# Patient Record
Sex: Male | Born: 1980 | Race: White | Hispanic: No | Marital: Single | Smoking: Former smoker
Health system: Southern US, Community
[De-identification: ages and names within clinical notes are randomized; demographics above are authoritative.]

## PROBLEM LIST (undated history)

## (undated) DIAGNOSIS — R911 Solitary pulmonary nodule: Secondary | ICD-10-CM

## (undated) HISTORY — DX: Solitary pulmonary nodule: R91.1

---

## 2010-08-13 ENCOUNTER — Encounter: Payer: Self-pay | Admitting: Physician Assistant

## 2010-08-15 ENCOUNTER — Emergency Department: Payer: Self-pay | Admitting: Emergency Medicine

## 2010-08-30 ENCOUNTER — Encounter: Payer: Self-pay | Admitting: Physician Assistant

## 2016-01-14 DIAGNOSIS — M459 Ankylosing spondylitis of unspecified sites in spine: Secondary | ICD-10-CM | POA: Insufficient documentation

## 2016-01-23 DIAGNOSIS — G8929 Other chronic pain: Secondary | ICD-10-CM | POA: Insufficient documentation

## 2016-01-23 DIAGNOSIS — M545 Low back pain: Secondary | ICD-10-CM

## 2016-01-23 DIAGNOSIS — M436 Torticollis: Secondary | ICD-10-CM | POA: Insufficient documentation

## 2016-02-11 DIAGNOSIS — R911 Solitary pulmonary nodule: Secondary | ICD-10-CM | POA: Insufficient documentation

## 2016-02-14 DIAGNOSIS — R55 Syncope and collapse: Secondary | ICD-10-CM | POA: Insufficient documentation

## 2016-02-14 DIAGNOSIS — M160 Bilateral primary osteoarthritis of hip: Secondary | ICD-10-CM | POA: Insufficient documentation

## 2016-08-01 ENCOUNTER — Ambulatory Visit (INDEPENDENT_AMBULATORY_CARE_PROVIDER_SITE_OTHER): Payer: 59 | Admitting: Internal Medicine

## 2016-08-01 ENCOUNTER — Encounter: Payer: Self-pay | Admitting: Internal Medicine

## 2016-08-01 VITALS — BP 90/68 | HR 66 | Resp 16 | Ht 64.0 in | Wt 154.0 lb

## 2016-08-01 DIAGNOSIS — R0602 Shortness of breath: Secondary | ICD-10-CM | POA: Diagnosis not present

## 2016-08-01 DIAGNOSIS — R918 Other nonspecific abnormal finding of lung field: Secondary | ICD-10-CM

## 2016-08-01 NOTE — Progress Notes (Signed)
[facility]      Date: 08/01/2016,   MRN# [id number] Xavier Curtis Melichar [birth year] Code Status:  Code Status History    This patient does not have a recorded code status. Please follow your organizational policy for patients in this situation.     Hosp day:@LENGTHOFSTAYDAYS @ Referring MD: @ATDPROV @     PCP:      AdmissionWeight: 154 lb (69.9 kg)                 CurrentWeight: 154 lb (69.9 kg) Xavier Curtis Curtis is a 36 y.o. old male seen in consultation for pulm  nodules at the request of D. Renard MatterBock.     CHIEF COMPLAINT:   Lung spots on CT scan   HISTORY OF PRESENT ILLNESS  36 yo AAM with h/o Ankylosing Spondylitis with incidental findings of multiple b/l nodules on CT chest Reports states that there are multiple, b/l 3-8MM in size.  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     PAST MEDICAL HISTORY   Past Medical History:  Diagnosis Date  . Lung nodule      SURGICAL HISTORY   History reviewed. No pertinent surgical history.   FAMILY HISTORY   Family History  Problem Relation Age of Onset  . Carpal tunnel syndrome Mother   . Diabetes Father   . Hypertension Father   . Hyperlipidemia Father   . Osteoporosis Father      SOCIAL HISTORY   Social History  Substance Use Topics  . Smoking status: Former Games developermoker  . Smokeless tobacco: Never Used  . Alcohol use Yes     MEDICATIONS    Home Medication:  Current Outpatient Rx  . Order #: [id number] Class: Historical Med  . Order #: [id number] Class: Historical Med  . Order #: [id number] Class: Historical Med    Current Medication:  Current Outpatient Prescriptions:  .  cyclobenzaprine (FLEXERIL) 10 MG tablet,  Take 10 mg by mouth at bedtime as needed., Disp: , Rfl:  .  diazepam (VALIUM) 10 MG tablet, Take 10 mg by mouth every 12 (twelve) hours as needed., Disp: , Rfl:  .  indomethacin (INDOCIN) 25 MG capsule, Take 25 mg by mouth 2 (two) times daily as needed. , Disp: , Rfl:     ALLERGIES   Patient has no known allergies.     REVIEW OF SYSTEMS    Review of Systems:  Gen:  Denies  fever, sweats, chills weigh loss  HEENT: Denies blurred vision, double vision, ear pain, eye pain, hearing loss, nose bleeds, sore throat Cardiac:  No dizziness, chest pain or heaviness, chest tightness,edema Resp:   Denies cough or sputum porduction, shortness of breath,wheezing, hemoptysis,  Gi: Denies swallowing difficulty, stomach pain, nausea or vomiting, diarrhea, constipation, bowel incontinence Gu:  Denies bladder incontinence, burning urine Ext:   Denies Joint pain, stiffness or swelling Skin: Denies  skin rash, easy bruising or bleeding or hives Endoc:  Denies polyuria, polydipsia , polyphagia or weight change Psych:   Denies depression, insomnia or hallucinations   Other:  All other systems negative   VS: BP 90/68 (BP Location: Left Arm, Patient Position: Sitting, Cuff Size: Normal)   Pulse 66  Resp 16   Ht 5\' 4"  (1.626 m)   Wt 154 lb (69.9 kg)   SpO2 98%   BMI 26.43 kg/m      PHYSICAL EXAM  Physical Examination:   GENERAL:NAD, no fevers, chills, no weakness no fatigue HEAD: Normocephalic, atraumatic.  EYES: Pupils equal, round, reactive to light. Extraocular muscles intact. No scleral icterus.  MOUTH: Moist mucosal membrane. Dentition intact. No abscess noted.  EAR, NOSE, THROAT: Clear without exudates. No external lesions.  NECK: Supple. No thyromegaly. No nodules. No JVD.  PULMONARY: Diffuse coarse rhonchi right sided +wheezes CARDIOVASCULAR: S1 and S2. Regular rate and rhythm. No murmurs, rubs, or gallops. No edema. Pedal pulses 2+ bilaterally.  GASTROINTESTINAL: Soft,  nontender, nondistended. No masses. Positive bowel sounds. No hepatosplenomegaly.  MUSCULOSKELETAL: No swelling, clubbing, or edema. Range of motion full in all extremities.  NEUROLOGIC: Cranial nerves II through XII are intact. No gross focal neurological deficits. Sensation intact. Reflexes intact.  SKIN: No ulceration, lesions, rashes, or cyanosis. Skin warm and dry. Turgor intact.  PSYCHIATRIC: Mood, affect within normal limits. The patient is awake, alert and oriented x 3. Insight, judgment intact.    IMAGING    No results found.    ASSESSMENT/PLAN   36 yo AAM with incidental findings  of b/l pulmonary sub centimeter nodules  I will need to see CT scans for better assessment I have explained to patient to obtain these scans and will follow up  AT this time, I do not believe these nodules are infections, possible inflammatory. His AS is severe and will need to take biological therapy for his condition. These nodules may have been there for awhile.  Will need to re-assess when CT scans are available  Patient satisfied with Plan of action and management. All questions answered  Lucie Leather, M.D.  [facility]  Medical Director [facility] Medical Director [facility] Cardio-Pulmonary Department

## 2016-08-01 NOTE — Patient Instructions (Signed)
I NEED TO SEE CT SCANS

## 2016-12-25 ENCOUNTER — Encounter: Payer: Self-pay | Admitting: Emergency Medicine

## 2016-12-25 ENCOUNTER — Emergency Department
Admission: EM | Admit: 2016-12-25 | Discharge: 2016-12-25 | Disposition: A | Discharge status: Home / Self Care | Payer: BLUE CROSS/BLUE SHIELD | Attending: Emergency Medicine | Admitting: Emergency Medicine

## 2016-12-25 DIAGNOSIS — F419 Anxiety disorder, unspecified: Secondary | ICD-10-CM | POA: Insufficient documentation

## 2016-12-25 NOTE — ED Triage Notes (Addendum)
Says wants to talk to someone. Anxiety.  Says he feels depressed, but does not want to harm self

## 2016-12-25 NOTE — ED Notes (Signed)
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  He says his therapist told him to come here for that, but did not realize how this all works.  Does not want to change into  Scubs, and is not suicidal.  He says he is going to leave and will call his therapist.

## 2017-09-17 ENCOUNTER — Encounter: Payer: Self-pay | Admitting: Internal Medicine

## 2020-10-09 ENCOUNTER — Other Ambulatory Visit: Payer: Self-pay

## 2020-10-09 ENCOUNTER — Emergency Department: Payer: Medicare Other

## 2020-10-09 ENCOUNTER — Emergency Department
Admission: EM | Admit: 2020-10-09 | Discharge: 2020-10-09 | Disposition: A | Discharge status: Home / Self Care | Payer: Medicare Other | Attending: Emergency Medicine | Admitting: Emergency Medicine

## 2020-10-09 DIAGNOSIS — M546 Pain in thoracic spine: Secondary | ICD-10-CM | POA: Diagnosis present

## 2020-10-09 DIAGNOSIS — Z87891 Personal history of nicotine dependence: Secondary | ICD-10-CM | POA: Insufficient documentation

## 2020-10-09 DIAGNOSIS — R42 Dizziness and giddiness: Secondary | ICD-10-CM | POA: Insufficient documentation

## 2020-10-09 DIAGNOSIS — Z20822 Contact with and (suspected) exposure to covid-19: Secondary | ICD-10-CM | POA: Diagnosis not present

## 2020-10-09 DIAGNOSIS — R0789 Other chest pain: Secondary | ICD-10-CM | POA: Diagnosis not present

## 2020-10-09 DIAGNOSIS — R55 Syncope and collapse: Secondary | ICD-10-CM | POA: Insufficient documentation

## 2020-10-09 DIAGNOSIS — R519 Headache, unspecified: Secondary | ICD-10-CM | POA: Diagnosis not present

## 2020-10-09 DIAGNOSIS — M542 Cervicalgia: Secondary | ICD-10-CM | POA: Diagnosis not present

## 2020-10-09 DIAGNOSIS — M450 Ankylosing spondylitis of multiple sites in spine: Secondary | ICD-10-CM | POA: Diagnosis not present

## 2020-10-09 LAB — TROPONIN I (HIGH SENSITIVITY)
Troponin I (High Sensitivity): 3 ng/L (ref <18)
Troponin I (High Sensitivity): 3 ng/L (ref <18)

## 2020-10-09 LAB — CBC WITH DIFFERENTIAL/PLATELET
Abs Immature Granulocytes: 0.01 10*3/uL (ref 0.00–0.07)
Basophils Absolute: 0 10*3/uL (ref 0.0–0.1)
Basophils Relative: 0 %
Eosinophils Absolute: 0.1 10*3/uL (ref 0.0–0.5)
Eosinophils Relative: 2 %
HCT: 42.1 % (ref 39.0–52.0)
Hemoglobin: 14.8 g/dL (ref 13.0–17.0)
Immature Granulocytes: 0 %
Lymphocytes Relative: 25 %
Lymphs Abs: 1.1 10*3/uL (ref 0.7–4.0)
MCH: 33 pg (ref 26.0–34.0)
MCHC: 35.2 g/dL (ref 30.0–36.0)
MCV: 94 fL (ref 80.0–100.0)
Monocytes Absolute: 0.5 10*3/uL (ref 0.1–1.0)
Monocytes Relative: 11 %
Neutro Abs: 2.8 10*3/uL (ref 1.7–7.7)
Neutrophils Relative %: 62 %
Platelets: 206 10*3/uL (ref 150–400)
RBC: 4.48 MIL/uL (ref 4.22–5.81)
RDW: 12.2 % (ref 11.5–15.5)
WBC: 4.5 10*3/uL (ref 4.0–10.5)
nRBC: 0 % (ref 0.0–0.2)

## 2020-10-09 LAB — COMPREHENSIVE METABOLIC PANEL
ALT: 14 U/L (ref 0–44)
AST: 19 U/L (ref 15–41)
Albumin: 4.1 g/dL (ref 3.5–5.0)
Alkaline Phosphatase: 63 U/L (ref 38–126)
Anion gap: 9 (ref 5–15)
BUN: 14 mg/dL (ref 6–20)
CO2: 25 mmol/L (ref 22–32)
Calcium: 9 mg/dL (ref 8.9–10.3)
Chloride: 102 mmol/L (ref 98–111)
Creatinine, Ser: 0.87 mg/dL (ref 0.61–1.24)
GFR, Estimated: >60 mL/min (ref >60)
Glucose, Bld: 88 mg/dL (ref 70–99)
Potassium: 3.9 mmol/L (ref 3.5–5.1)
Sodium: 136 mmol/L (ref 135–145)
Total Bilirubin: 1.9 mg/dL — ABNORMAL HIGH (ref 0.3–1.2)
Total Protein: 7.2 g/dL (ref 6.5–8.1)

## 2020-10-09 LAB — RESP PANEL BY RT-PCR (FLU A&B, COVID) ARPGX2
Influenza A by PCR: NEGATIVE
Influenza B by PCR: NEGATIVE
SARS Coronavirus 2 by RT PCR: NEGATIVE

## 2020-10-09 LAB — D-DIMER, QUANTITATIVE: D-Dimer, Quant: 0.38 ug/mL-FEU (ref 0.00–0.50)

## 2020-10-09 LAB — MAGNESIUM: Magnesium: 1.9 mg/dL (ref 1.7–2.4)

## 2020-10-09 LAB — CK: Total CK: 113 U/L (ref 49–397)

## 2020-10-09 MED ORDER — LACTATED RINGERS IV BOLUS
1000.0000 mL | Freq: Once | INTRAVENOUS | Status: AC
  Administered 2020-10-09: 1000 mL via INTRAVENOUS

## 2020-10-09 NOTE — ED Notes (Signed)
Pt to Xray.

## 2020-10-09 NOTE — ED Provider Notes (Signed)
[facility] Emergency Department Provider Note  ____________________________________________   Event Date/Time   First MD Initiated Contact with Patient 10/09/20 1136     (approximate)  I have reviewed the triage vital signs and the nursing notes.   HISTORY  Chief Complaint Loss of Consciousness   HPI Xavier Curtis is a 40 y.o. male with a past medical history of ankylosing spondylitis followed by rheumatology previously on Enbrel and Humira but not currently taking these medicines due to some insurance issues and pulse stopped this month due to some rash she develop.  He states today he felt lightheaded and dizzy and had some chest pressure and lay down on the floor because he felt he was going pass out.  His father states he does not think he lost consciousness as he witnessed him down although patient states he is not sure if he hit her head is or not because he has a little bit of posterior headache and neck pain that seems little more than usual.  He has not had any new vomiting, diarrhea, dysuria, abdominal pain or lower back pain or focal weakness numbness or tingling.  States he was told by his rheumatologist that his rash is likely from his ankylosing spondylitis medications which he is no longer taking.  He has not had any significant redness and there is no drainage.  No other clear acute concerns at this time.  States something similar happened where he felt he is in a pass out several months ago.  No other acute concerns at this time.         Past Medical History:  Diagnosis Date  . Lung nodule     Patient Active Problem List   Diagnosis Date Noted  . Cardiac syncope 02/14/2016  . Primary osteoarthritis of both hips 02/14/2016  . Incidental lung nodule, > 3mm and < 8mm 02/11/2016  . Chronic midline low back pain without sciatica 01/23/2016  . Neck stiffness 01/23/2016  . Ankylosing spondylitis (HCC) 01/14/2016    No past surgical history  on file.  Prior to Admission medications   Medication Sig Start Date End Date Taking? Authorizing Provider  cyclobenzaprine (FLEXERIL) 10 MG tablet Take 10 mg by mouth at bedtime as needed. 01/21/16   [provider]  diazepam (VALIUM) 10 MG tablet Take 10 mg by mouth every 12 (twelve) hours as needed.    [provider]  indomethacin (INDOCIN) 25 MG capsule Take 25 mg by mouth 2 (two) times daily as needed.     [provider]    Allergies Patient has no known allergies.  Family History  Problem Relation Age of Onset  . Carpal tunnel syndrome Mother   . Diabetes Father   . Hypertension Father   . Hyperlipidemia Father   . Osteoporosis Father     Social History Social History   Tobacco Use  . Smoking status: Former    Pack years: 0.00  . Smokeless tobacco: Never  Vaping Use  . Vaping Use: Never used  Substance Use Topics  . Alcohol use: Yes  . Drug use: Yes    Types: Marijuana    Review of Systems  Review of Systems  Constitutional:  Negative for chills and fever.  HENT:  Negative for sore throat.   Eyes:  Negative for pain.  Respiratory:  Negative for cough and stridor.   Cardiovascular:  Positive for chest pain.  Gastrointestinal:  Positive for nausea. Negative for abdominal pain and vomiting.  Genitourinary:  Negative for dysuria.  Musculoskeletal:  Positive for back pain (chronic), joint pain (chronic diffuse spine pain) and neck pain (chronic, maybe slightly worse today).  Skin:  Positive for rash.  Neurological:  Positive for weakness. Negative for seizures and headaches.  Psychiatric/Behavioral:  Negative for suicidal ideas.   All other systems reviewed and are negative.    ____________________________________________   PHYSICAL EXAM:  VITAL SIGNS: ED Triage Vitals  Enc Vitals Group     BP      Pulse      Resp      Temp      Temp src      SpO2      Weight      Height      Head Circumference      Peak Flow       Pain Score      Pain Loc      Pain Edu?      Excl. in GC?    Vitals:   10/09/20 1330 10/09/20 1442  BP: 108/69   Pulse: 72 92  Resp: 17   Temp:    SpO2: 97% 97%   Physical Exam Vitals and nursing note reviewed.  Constitutional:      Appearance: He is well-developed.  HENT:     Head: Normocephalic and atraumatic.     Right Ear: External ear normal.     Left Ear: External ear normal.     Nose: Nose normal.     Mouth/Throat:     Mouth: Mucous membranes are moist.  Eyes:     Conjunctiva/sclera: Conjunctivae normal.  Cardiovascular:     Rate and Rhythm: Normal rate and regular rhythm.     Heart sounds: No murmur heard. Pulmonary:     Effort: Pulmonary effort is normal. No respiratory distress.     Breath sounds: Normal breath sounds.  Abdominal:     Palpations: Abdomen is soft.     Tenderness: There is no abdominal tenderness.  Musculoskeletal:     Cervical back: Neck supple.  Skin:    General: Skin is warm and dry.     Capillary Refill: Capillary refill takes less than 2 seconds.  Neurological:     Mental Status: He is alert and oriented to person, place, and time.  Psychiatric:        Mood and Affect: Mood normal.    Cranial nerves II through XII grossly intact.    Symmetric 5/5 strength of all extremities.  Sensation intact to light touch in all extremities.  Unremarkable unassisted gait.  Some mild tenderness over the C-spine without any significant midline tenderness over the T or L spine and no step-offs or deformities.  2+ radial pulses.  There is some scattered pustular lesions without drainage or surrounding skin changes over the bilateral forearms.  ____________________________________________   LABS (all labs ordered are listed, but only abnormal results are displayed)  Labs Reviewed  COMPREHENSIVE METABOLIC PANEL - Abnormal; Notable for the following components:      Result Value   Total Bilirubin 1.9 (*)    All other components within normal limits   RESP PANEL BY RT-PCR (FLU A&B, COVID) ARPGX2  CBC WITH DIFFERENTIAL/PLATELET  D-DIMER, QUANTITATIVE  MAGNESIUM  CK  CBG MONITORING, ED  TROPONIN I (HIGH SENSITIVITY)  TROPONIN I (HIGH SENSITIVITY)   ____________________________________________  EKG  Sinus rhythm with a ventricular to 54, left axis deviation, unremarkable intervals some nonspecific changes in lead III and lead I and  aVL.  No significant arrhythmia. ____________________________________________  RADIOLOGY  ED MD interpretation: Chest x-ray has no edema, focal consolidation, effusion, pneumothorax or other clear acute thoracic process.  CT head shows no clear acute intracranial process including evidence of skull fracture or SAH.  CT C-spine shows redemonstrated findings consistent with ankylosing spondylitis as well as some widening at the disc base of C4 on C5 with some lordosis.  This is similar to films obtained in 2012.  There is also trace-retrolisthesis of C4 on C5 similar to prior.  MR C-spine shows widening of the C4-C5 disc base anteriorly with focal lordosis possibly from her injury or some ligamentous laxity.  No edema to suggest acute injury.  No fracture.  There is some disc base widening seen on imaging in 2012 (this time there is also some degenerative changes at C4 and C5 with moderate canal stenosis on the right and moderate left canal stenosis.  Official radiology report(s): DG Chest 2 View  Result Date: 10/09/2020 CLINICAL DATA:  Syncope. EXAM: CHEST - 2 VIEW COMPARISON:  No pertinent prior exams available for comparison. FINDINGS: Heart size within normal limits. Linear opacities within the right mid to lower lung field with an appearance most suggestive of scarring. No appreciable airspace consolidation or pulmonary edema. No evidence of pleural effusion or pneumothorax. No acute bony abnormality identified. Midthoracic dextrocurvature. IMPRESSION: No evidence of acute cardiopulmonary abnormality. Linear  opacities within the right mid to lower lung field with an appearance most suggestive of scarring. Midthoracic dextrocurvature. Electronically Signed   By: Jackey Loge DO   On: 10/09/2020 13:08   CT Head Wo Contrast  Result Date: 10/09/2020 CLINICAL DATA:  Headache, intracranial hemorrhage suspected. Neck trauma, midline tenderness. Additional provided: Syncopal episode. EXAM: CT HEAD WITHOUT CONTRAST CT CERVICAL SPINE WITHOUT CONTRAST TECHNIQUE: Multidetector CT imaging of the head and cervical spine was performed following the standard protocol without intravenous contrast. Multiplanar CT image reconstructions of the cervical spine were also generated. COMPARISON:  Cervical spine radiographs 08/16/2010. Report from radiographs of the cervical spine 01/23/2016 (images unavailable). FINDINGS: CT HEAD FINDINGS Brain: Cerebral volume is normal. There is no acute intracranial hemorrhage. No demarcated cortical infarct. No extra-axial fluid collection. No evidence of an intracranial mass. No midline shift. Vascular: No hyperdense vessel. Skull: Normal. Negative for fracture or focal lesion. Sinuses/Orbits: Visualized orbits show no acute finding. Trace scattered paranasal sinus mucosal thickening at the imaged levels. CT CERVICAL SPINE FINDINGS Alignment: Focal lordotic angulation at the C4-C5 level. There is widening of the disc space anteriorly at C4-C5 measuring 5 mm. This is similar in appearance as compared to the prior cervical spine radiographs of 08/16/2010. Trace C4-C5 grade 1 retrolisthesis. Skull base and vertebrae: Bilateral atlantooccipital fusion. The basion-dental and atlanto-dental intervals are maintained. MRI would be needed to definitively exclude a fracture through the C4-C5 disc space. No evidence of acute fracture to the cervical spine elsewhere. Redemonstrated findings of ankylosing spondylitis with bridging syndesmophytes, early osseous fusion across the disc space and facet joint ankylosis  at multiple levels throughout the cervical and visualized upper thoracic spine. Notably, there are no bridging syndesmophytes at C4-C5, and there is no facet joint ankylosis on the right at C4-C5 or on the left at C3-C4. Soft tissues and spinal canal: No prevertebral fluid or swelling. No visible canal hematoma. Disc levels: Cervical spondylosis greatest at C4-C5. At C4-C5, there is mild disc space narrowing, broad-based central disc protrusion. Endplate spurring with bilateral disc osteophyte ridge/uncinate hypertrophy. Facet arthrosis and ligamentum  flavum hypertrophy. Apparent mild/moderate spinal canal stenosis. Advanced right-sided bony neural foraminal narrowing. Upper chest: No consolidation within the imaged lung apices. Scarring within the right lung apex. IMPRESSION: CT head: 1. No evidence of acute intracranial abnormality. 2. Mild paranasal sinus mucosal thickening at the imaged levels. CT cervical spine: 1. Redemonstrated findings compatible with ankylosing spondylitis, as described. 2. Widening of the disc space anteriorly at C4-C5 with somewhat focal lordosis centered at this level. These findings are similar as compared to the cervical spine radiographs of 08/16/2010. However, in the setting of trauma an MRI would be needed to definitively exclude a fracture through the C4-C5 disc space or ligamentous injury at this level. 3. Trace C4-C5 grade 1 retrolisthesis, also similar to the prior cervical spine radiographs of 08/16/2010. 4. Cervical spondylosis, as described and greatest at C4-C5. Electronically Signed   By: Jackey Loge DO   On: 10/09/2020 13:01   CT Cervical Spine Wo Contrast  Result Date: 10/09/2020 CLINICAL DATA:  Headache, intracranial hemorrhage suspected. Neck trauma, midline tenderness. Additional provided: Syncopal episode. EXAM: CT HEAD WITHOUT CONTRAST CT CERVICAL SPINE WITHOUT CONTRAST TECHNIQUE: Multidetector CT imaging of the head and cervical spine was performed following  the standard protocol without intravenous contrast. Multiplanar CT image reconstructions of the cervical spine were also generated. COMPARISON:  Cervical spine radiographs 08/16/2010. Report from radiographs of the cervical spine 01/23/2016 (images unavailable). FINDINGS: CT HEAD FINDINGS Brain: Cerebral volume is normal. There is no acute intracranial hemorrhage. No demarcated cortical infarct. No extra-axial fluid collection. No evidence of an intracranial mass. No midline shift. Vascular: No hyperdense vessel. Skull: Normal. Negative for fracture or focal lesion. Sinuses/Orbits: Visualized orbits show no acute finding. Trace scattered paranasal sinus mucosal thickening at the imaged levels. CT CERVICAL SPINE FINDINGS Alignment: Focal lordotic angulation at the C4-C5 level. There is widening of the disc space anteriorly at C4-C5 measuring 5 mm. This is similar in appearance as compared to the prior cervical spine radiographs of 08/16/2010. Trace C4-C5 grade 1 retrolisthesis. Skull base and vertebrae: Bilateral atlantooccipital fusion. The basion-dental and atlanto-dental intervals are maintained. MRI would be needed to definitively exclude a fracture through the C4-C5 disc space. No evidence of acute fracture to the cervical spine elsewhere. Redemonstrated findings of ankylosing spondylitis with bridging syndesmophytes, early osseous fusion across the disc space and facet joint ankylosis at multiple levels throughout the cervical and visualized upper thoracic spine. Notably, there are no bridging syndesmophytes at C4-C5, and there is no facet joint ankylosis on the right at C4-C5 or on the left at C3-C4. Soft tissues and spinal canal: No prevertebral fluid or swelling. No visible canal hematoma. Disc levels: Cervical spondylosis greatest at C4-C5. At C4-C5, there is mild disc space narrowing, broad-based central disc protrusion. Endplate spurring with bilateral disc osteophyte ridge/uncinate hypertrophy. Facet  arthrosis and ligamentum flavum hypertrophy. Apparent mild/moderate spinal canal stenosis. Advanced right-sided bony neural foraminal narrowing. Upper chest: No consolidation within the imaged lung apices. Scarring within the right lung apex. IMPRESSION: CT head: 1. No evidence of acute intracranial abnormality. 2. Mild paranasal sinus mucosal thickening at the imaged levels. CT cervical spine: 1. Redemonstrated findings compatible with ankylosing spondylitis, as described. 2. Widening of the disc space anteriorly at C4-C5 with somewhat focal lordosis centered at this level. These findings are similar as compared to the cervical spine radiographs of 08/16/2010. However, in the setting of trauma an MRI would be needed to definitively exclude a fracture through the C4-C5 disc space or ligamentous injury at  this level. 3. Trace C4-C5 grade 1 retrolisthesis, also similar to the prior cervical spine radiographs of 08/16/2010. 4. Cervical spondylosis, as described and greatest at C4-C5. Electronically Signed   By: Jackey Loge DO   On: 10/09/2020 13:01   MR Cervical Spine Wo Contrast  Result Date: 10/09/2020 EXAM: MRI CERVICAL SPINE WITHOUT CONTRAST TECHNIQUE: Multiplanar, multisequence MR imaging of the cervical spine was performed. No intravenous contrast was administered. COMPARISON:  None. FINDINGS: Alignment: There is widening of the C4-C5 disc space anteriorly. Vertebrae: No bone marrow edema to suggest acute fracture. Vertebral body heights are maintained. No evidence of discitis/osteomyelitis or suspicious bone lesion. Findings of ankylosing spondylitis (including multilevel. Jean syndesmophytes and fusion across the disc spaces) were better characterized on same day CT. Also, fusion at the Lantus it'll joint. Cord: Normal cord signal. Posterior Fossa, vertebral arteries, paraspinal tissues: Visualized vertebral artery flow voids are maintained. No appreciable paraspinal or prevertebral edema. No clear edema  or discontinuity of the visualized cervical ligaments. Visualized posterior fossa is unremarkable on limited sagittal assessment. Disc levels: C2-C3: No significant disc protrusion, foraminal stenosis, or canal stenosis. C3-C4: Left facet hypertrophy with mild left foraminal stenosis. C4-C5: Widening of the anterior disc space, as described above. No edema in the disc. Posterior disc osteophyte complex with right greater than left facet and uncovertebral hypertrophy. Resulting severe right and moderate left foraminal stenosis. Moderate canal stenosis. C5-C6: Mild right uncovertebral hypertrophy with mild right foraminal stenosis. No significant canal stenosis. C6-C7: No significant disc protrusion, foraminal stenosis, or canal stenosis. C7-T1: No significant disc protrusion, foraminal stenosis, or canal stenosis. IMPRESSION: 1. Widening of the C4-C5 disc space anteriorly with focal lordosis, which may be due to ligamentous laxity or remote anterior longitudinal ligament injury. There is no edema at this level to suggest acute injury. Additionally, there was widening of the disc space at this level on the prior radiographs from 2010/07/23, although less pronounced at that time. 2. Focal degenerative changes at C4-C5 with moderate canal stenosis and severe right and moderate left foraminal stenosis. The focality of degenerative change at this level is likely secondary to motion at this level in the setting of fusion at the other levels (but no fusion at C4-C5). Given these findings and the findings in impression #1, neurosurgical consultation may be useful to evaluate if fusion is warranted. 3. Mild foraminal stenosis on the left at C3-C4 and the right at C5-C6. 4. Findings of ankylosis spondylitis were better characterized on same day CT. Findings discussed with Dr. Katrinka Blazing via telephone at 2:40 p.m. Electronically Signed   By: Feliberto Harts MD   On: 10/09/2020 15:00     ____________________________________________   PROCEDURES  Procedure(s) performed (including Critical Care):  .1-3 Lead EKG Interpretation  Date/Time: 10/09/2020 3:31 PM Performed by: Gilles Chiquito, MD Authorized by: Gilles Chiquito, MD     Interpretation: normal     ECG rate assessment: normal     Rhythm: sinus rhythm     Ectopy: none     Conduction: normal     ____________________________________________   INITIAL IMPRESSION / ASSESSMENT AND PLAN / ED COURSE      Patient presents with above-stated history exam for assessment of near syncopal episode associate with some chest pain the patient himself began experiencing a little bit of acute on chronic neck pain in setting of chronic back pain.  Patient states he has had a little nausea and some chest discomfort that was recently seen by his rheumatologist yesterday  and taken off Toltz with concerns that this was causing pustular rash in his arms.  With prior to syncopal episode differential includes orthostasis, vasovagal, anemia, arrhythmia, PE, metabolic derangements with concern for possible acute head or C-spine injury given patient stating he laid himself down and may have hit his head and has a little bit of neck pain seems to be a touch worse than usual.  Will ECG has a nonspecific findings given nonelevated troponin x2 I have a low suspicion for ACS or myocarditis.  No significant arrhythmia on ECG.  CBC shows no leukocytosis or acute anemia.  Patient is not orthostatic.  CMP shows no significant electrode or metabolic derangements.  D-dimer 0.38 and overall I have a low suspicion for PE at this time.  Magnesium and CK are unremarkable.  COVID influenza testing is negative.  Certainly possible patient's discomfort and nausea related to side effects of his Nelson Chimes versus other not immediate life-threatening process.  Chest x-ray has no edema, focal consolidation, effusion, pneumothorax or other clear acute thoracic  process.  CT head shows no clear acute intracranial process including evidence of skull fracture or SAH.  CT C-spine shows redemonstrated findings consistent with ankylosing spondylitis as well as some widening at the disc base of C4 on C5 with some lordosis.  This is similar to films obtained in 2012.  There is also trace-retrolisthesis of C4 on C5 similar to prior.  MR C-spine shows widening of the C4-C5 disc base anteriorly with focal lordosis possibly from her injury or some ligamentous laxity.  No edema to suggest acute injury.  No fracture.  There is some disc base widening seen on imaging in 2012 (this time there is also some degenerative changes at C4 and C5 with moderate canal stenosis on the right and moderate left canal stenosis.  Given absence of any clear focal deficits over lower suspicion for occult cervical visceral or other significant injury.  On reassessment patient states feeling little better and wishes to leave.  Given significant widening on MR did recommend he follow-up with neurosurgery for evaluation of possible cervical fusion as he is at very high risk for spinal cord injury if he experiences any neck trauma or significant flexion extension.  I explained this to the patient and his father at bedside.  Also advised that they follow-up with his PCP.  Discharged stable condition.  Strict return precautions advised discussed       ____________________________________________   FINAL CLINICAL IMPRESSION(S) / ED DIAGNOSES  Final diagnoses:  Syncope and collapse  Ankylosing spondylitis of multiple sites in spine (HCC)    Medications  lactated ringers bolus 1,000 mL (0 mLs Intravenous Stopped 10/09/20 1456)     ED Discharge Orders     None        Note:  This document was prepared using Dragon voice recognition software and may include unintentional dictation errors.    Gilles Chiquito, MD 10/09/20 [phone]

## 2020-10-09 NOTE — ED Notes (Signed)
Pt in MRI.

## 2020-10-09 NOTE — ED Triage Notes (Signed)
Pt to ED [facility] from home for syncopal episode witnessed by dad. Dad denies hitting head.  20 to L wrist EMS, 250 NS given Pt in NAD CBG 77  Pt states he has not been feeling good for the past few weeks

## 2021-11-05 IMAGING — MR MR CERVICAL SPINE W/O CM
5 series · 36 of 48 positions shown · non-contrast
Comparison: None.

EXAM:
MRI CERVICAL SPINE WITHOUT CONTRAST
TECHNIQUE: Multiplanar, multisequence MR imaging of the cervical spine was
performed. No intravenous contrast was administered.

[Series 5: T2 · sagittal · 3.0mm · 0.62mm/px · 6 of 15 slices shown (1 of 2)]
[im 1/15]
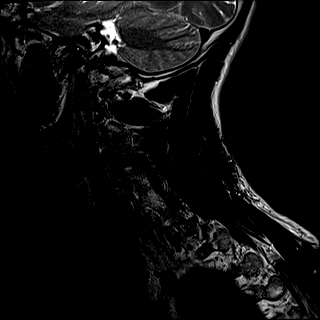
[im 3/15]
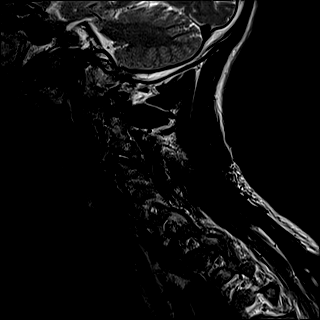
[im 6/15]
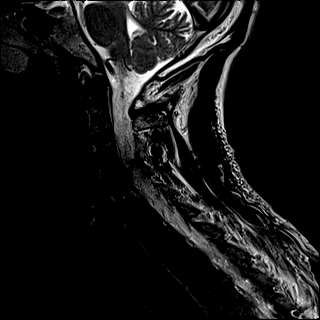
[im 9/15]
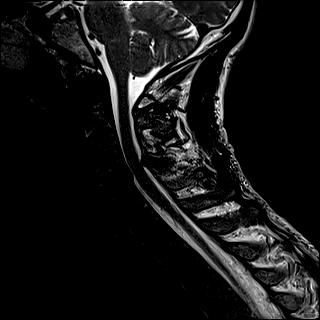
[im 12/15]
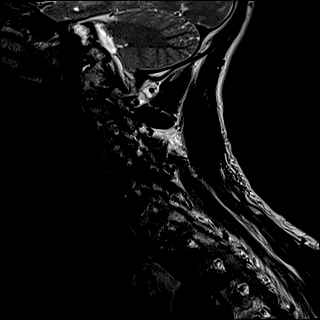
[im 15/15]
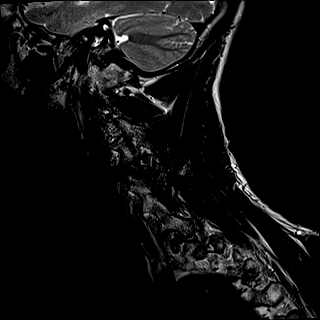

[Series 6: FLAIR · sagittal · 3.0mm · 0.78mm/px · 7 of 15 slices shown]
[im 1/15]
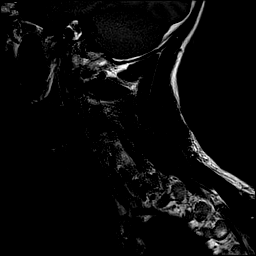
[im 3/15]
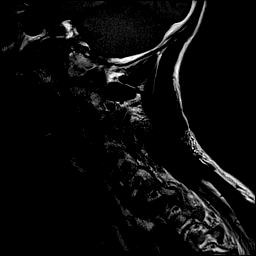
[im 5/15]
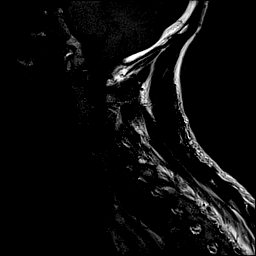
[im 8/15]
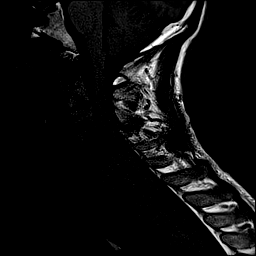
[im 10/15]
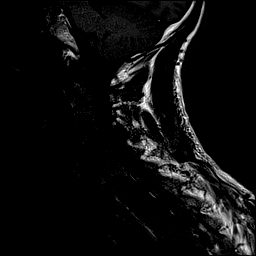
[im 12/15]
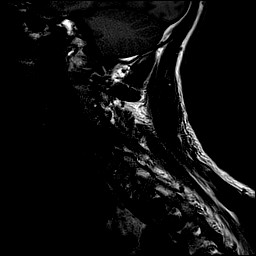
[im 15/15]
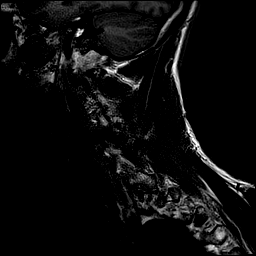

[Series 7: STIR · sagittal · 3.0mm · 0.62mm/px · 7 of 15 slices shown]
[im 1/15]
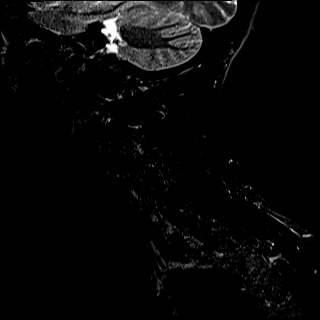
[im 3/15]
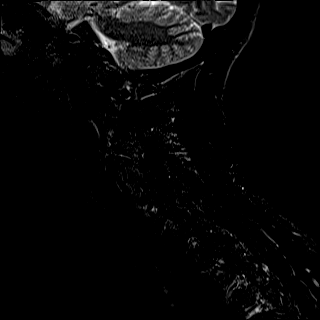
[im 5/15]
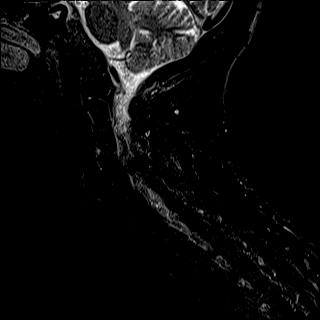
[im 8/15]
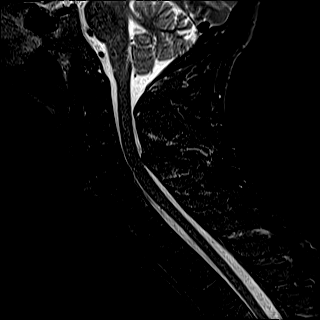
[im 10/15]
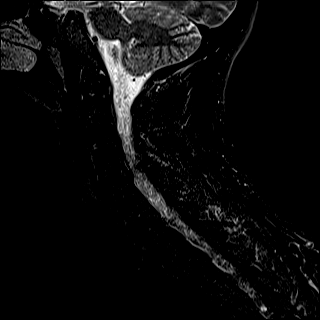
[im 12/15]
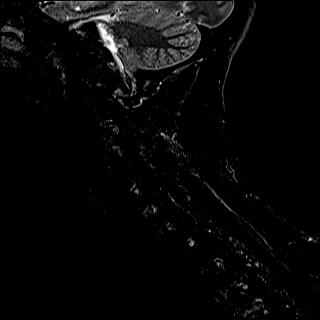
[im 15/15]
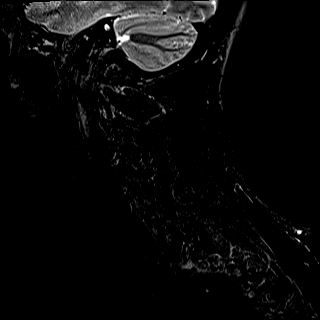

[Series 8: T2 · axial · 3.0mm · 0.70mm/px · z∈[-117,-23]mm · 8 of 29 slices shown (2 of 2)]
[im 1/29]
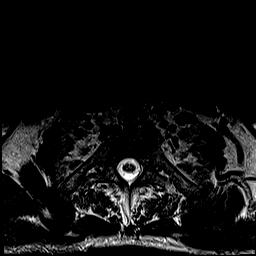
[im 5/29]
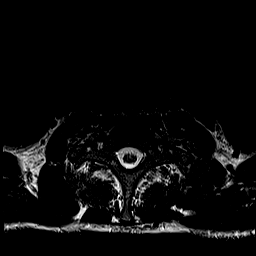
[im 9/29]
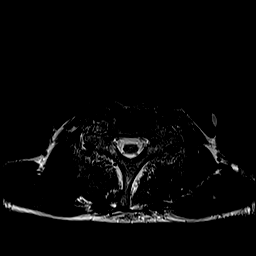
[im 13/29]
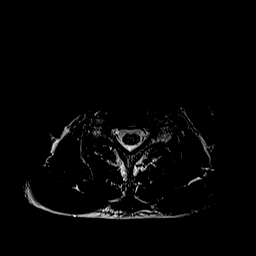
[im 16/29]
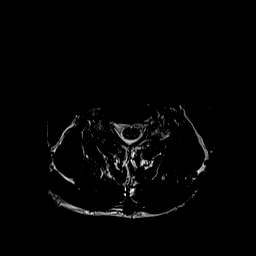
[im 20/29]
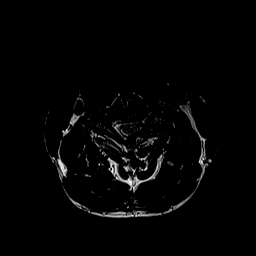
[im 24/29]
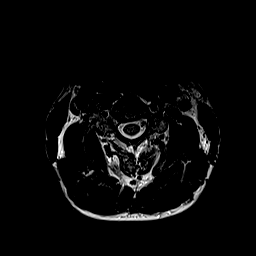
[im 29/29]
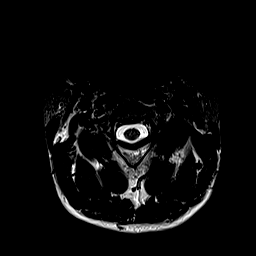

[Series 9: ax mpgr · axial · 3.0mm · 0.35mm/px · z∈[-117,-23]mm · 8 of 29 slices shown]
[im 1/29]
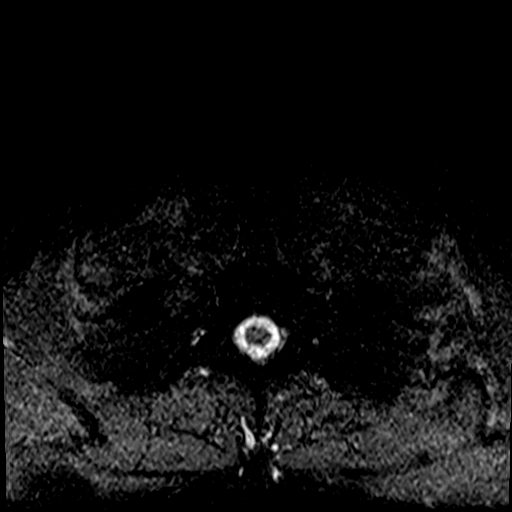
[im 5/29]
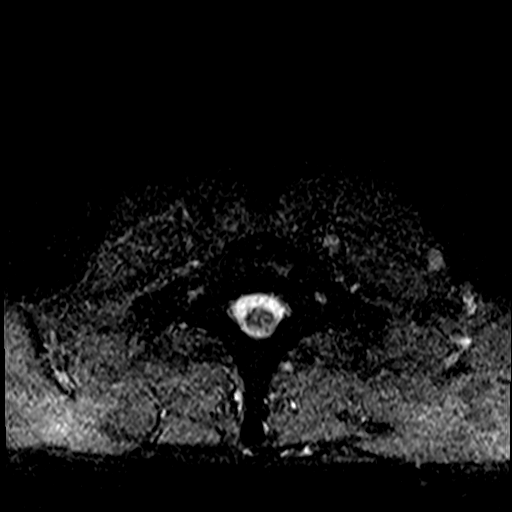
[im 9/29]
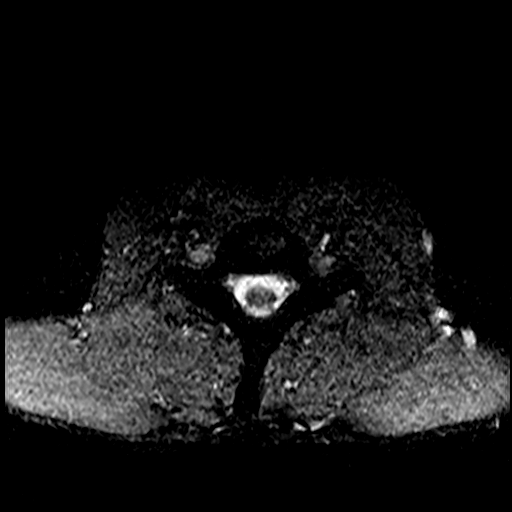
[im 13/29]
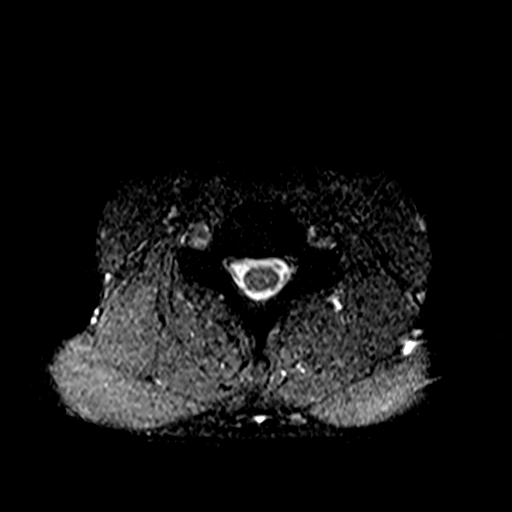
[im 16/29]
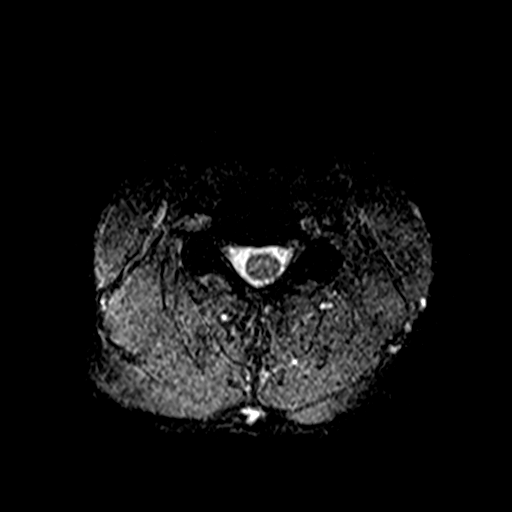
[im 20/29]
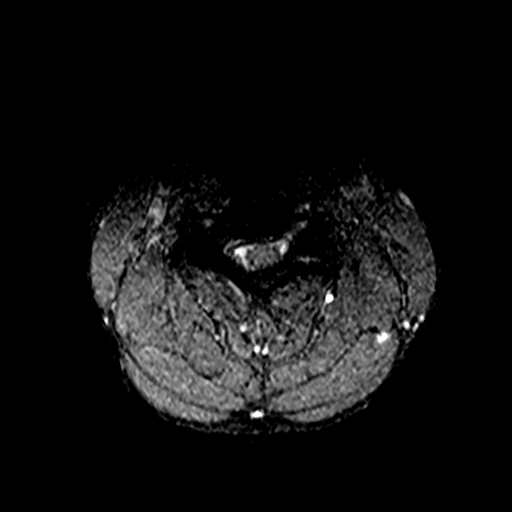
[im 24/29]
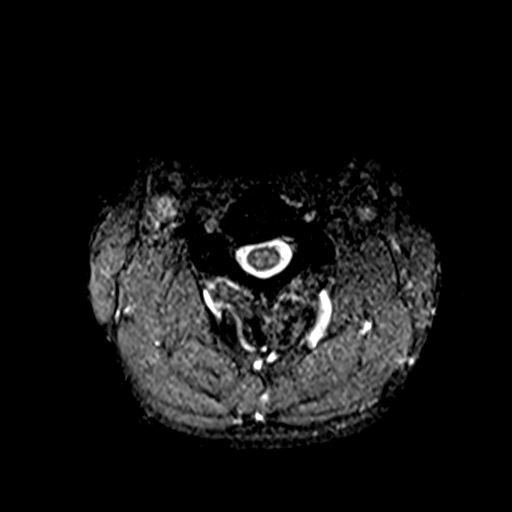
[im 29/29]
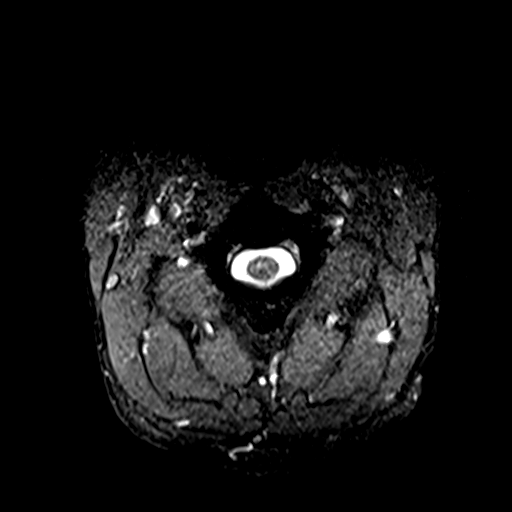

[36 of 48 positions shown; findings below may reference images not displayed]

FINDINGS: Alignment: There is widening of the C4-C5 disc space anteriorly.

Vertebrae: No bone marrow edema to suggest acute fracture. Vertebral
body heights are maintained. No evidence of discitis/osteomyelitis
or suspicious bone lesion. Findings of ankylosing spondylitis
(including multilevel. Bong syndesmophytes and fusion across the
disc spaces) were better characterized on same day CT. Also, fusion
at the Lantus it'll joint.

Cord: Normal cord signal.

Posterior Fossa, vertebral arteries, paraspinal tissues: Visualized
vertebral artery flow voids are maintained. No appreciable
paraspinal or prevertebral edema. No clear edema or discontinuity of
the visualized cervical ligaments. Visualized posterior fossa is
unremarkable on limited sagittal assessment.

Disc levels:

C2-C3: No significant disc protrusion, foraminal stenosis, or canal
stenosis.

C3-C4: Left facet hypertrophy with mild left foraminal stenosis.

C4-C5: Widening of the anterior disc space, as described above. No
edema in the disc. Posterior disc osteophyte complex with right
greater than left facet and uncovertebral hypertrophy. Resulting
severe right and moderate left foraminal stenosis. Moderate canal
stenosis.

C5-C6: Mild right uncovertebral hypertrophy with mild right
foraminal stenosis. No significant canal stenosis.

C6-C7: No significant disc protrusion, foraminal stenosis, or canal
stenosis.

C7-T1: No significant disc protrusion, foraminal stenosis, or canal
stenosis.
IMPRESSION: 1. Widening of the C4-C5 disc space anteriorly with focal lordosis,
which may be due to ligamentous laxity or remote anterior
longitudinal ligament injury. There is no edema at this level to
suggest acute injury. Additionally, there was widening of the disc
space at this level on the prior radiographs from 7097, although
less pronounced at that time.
2. Focal degenerative changes at C4-C5 with moderate canal stenosis
and severe right and moderate left foraminal stenosis. The focality
of degenerative change at this level is likely secondary to motion
at this level in the setting of fusion at the other levels (but no
fusion at C4-C5). Given these findings and the findings in
impression #1, neurosurgical consultation may be useful to evaluate
if fusion is warranted.
3. Mild foraminal stenosis on the left at C3-C4 and the right at
C5-C6.
4. Findings of ankylosis spondylitis were better characterized on
same day CT.

Findings discussed with Dr. Adrea via telephone at [DATE] p.m.

## 2021-11-05 IMAGING — CT CT CERVICAL SPINE W/O CM
3 of 4 series · 9 of 33 positions shown, 11 images · non-contrast
Comparison: Cervical spine radiographs 08/16/2010. Report from
radiographs of the cervical spine 01/23/2016 (images unavailable).

CLINICAL DATA: Headache, intracranial hemorrhage suspected. Neck
trauma, midline tenderness. Additional provided: Syncopal episode.

EXAM:
CT HEAD WITHOUT CONTRAST
CT CERVICAL SPINE WITHOUT CONTRAST
TECHNIQUE: Multidetector CT imaging of the head and cervical spine was
performed following the standard protocol without intravenous
contrast. Multiplanar CT image reconstructions of the cervical spine
were also generated.

[Series 4: sagittal bone · sagittal · 0.27mm/px · 5 of 61 slices shown, 6 images]
[im 21/61  bone]
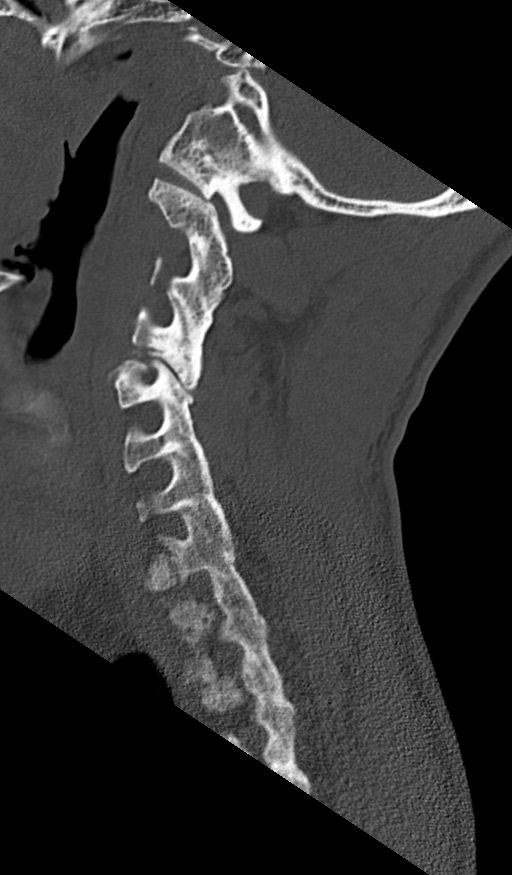
[im 26/61  bone]
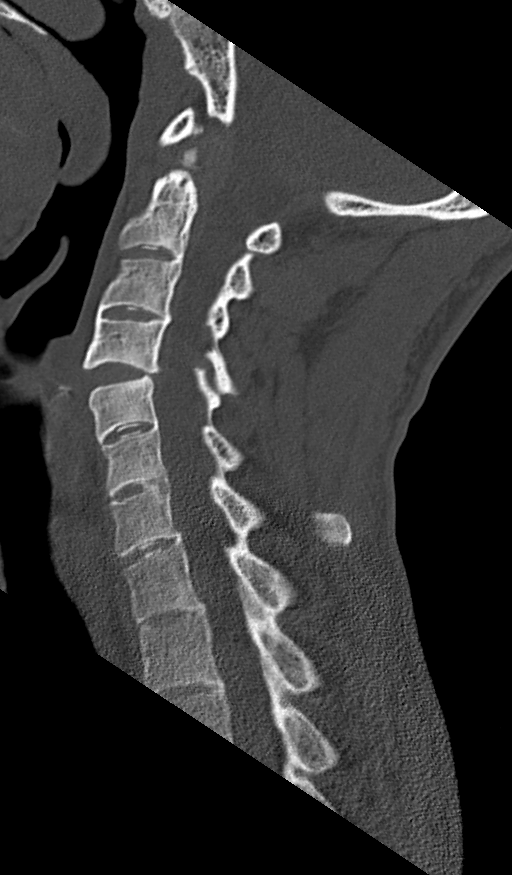
[im 31/61  soft-tissue]
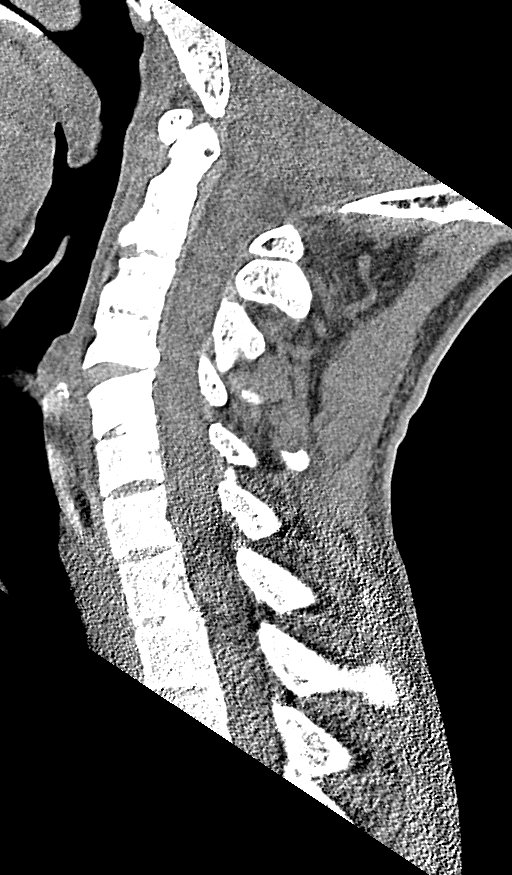
[im 31/61  bone]
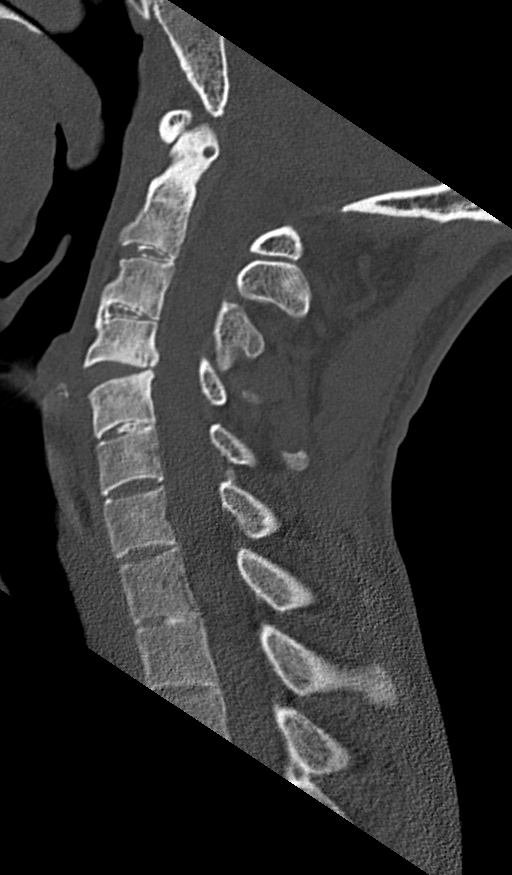
[im 36/61  bone]
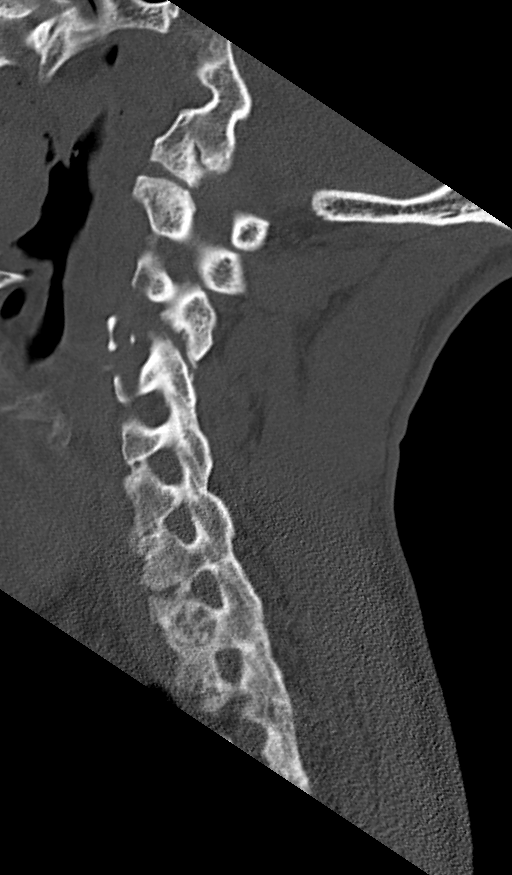
[im 41/61  bone]
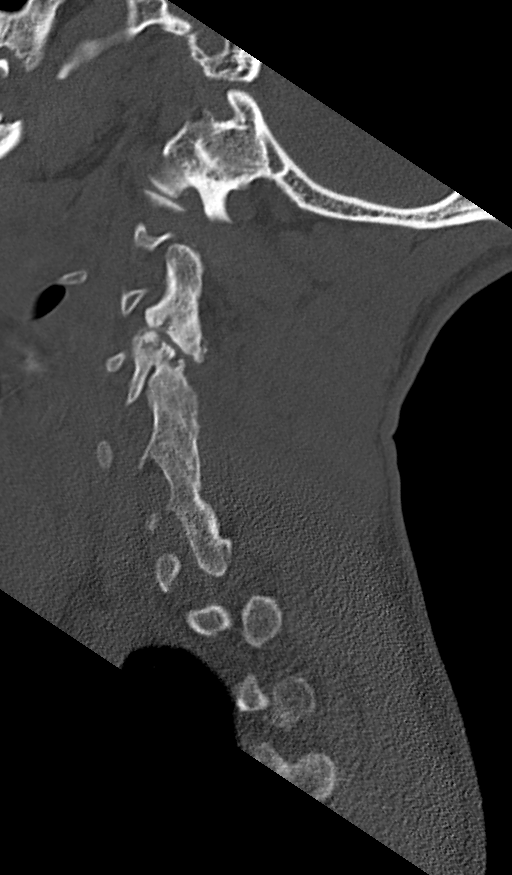

[Series 5: coronal bone · coronal · 0.23mm/px · 3 of 69 slices shown]
[im 20/69  bone]
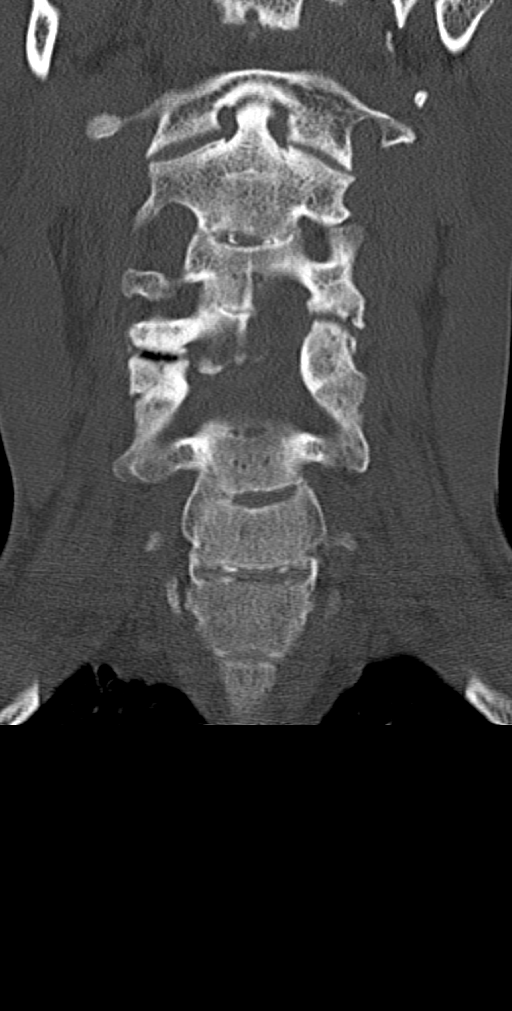
[im 30/69  bone]
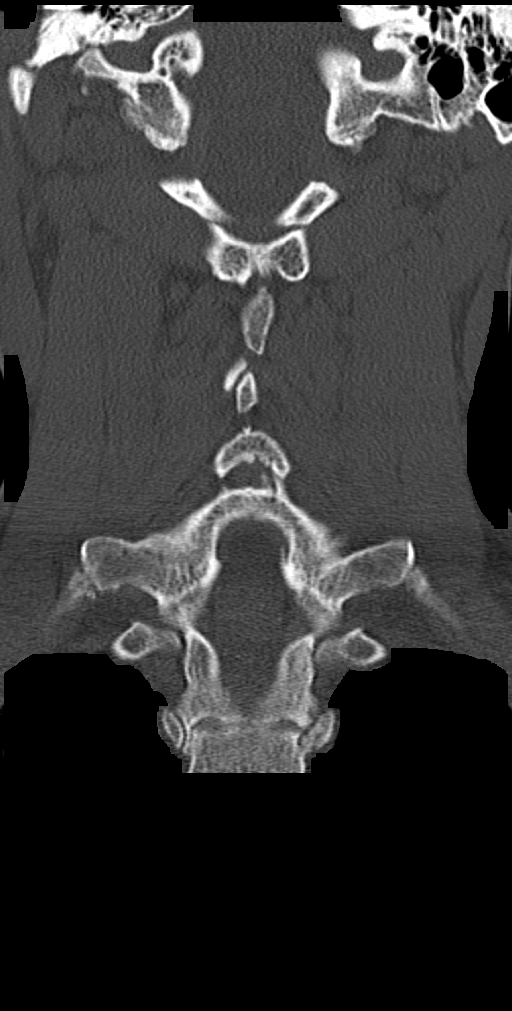
[im 40/69  bone]
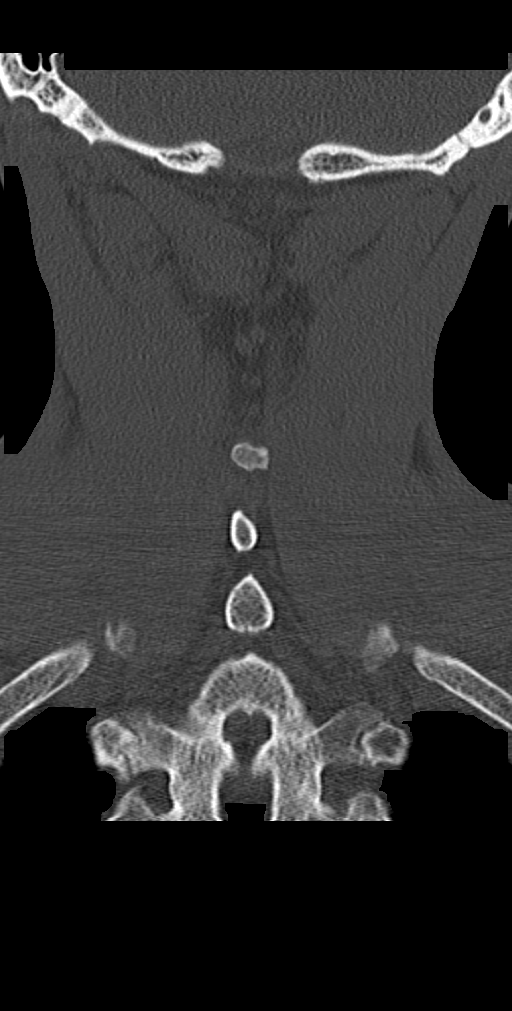

[Series 6: orthogonal bone · axial · 0.23mm/px · z∈[-237,-237]mm · 1 of 104 slices shown, 2 images]
[im 59/104  soft-tissue]
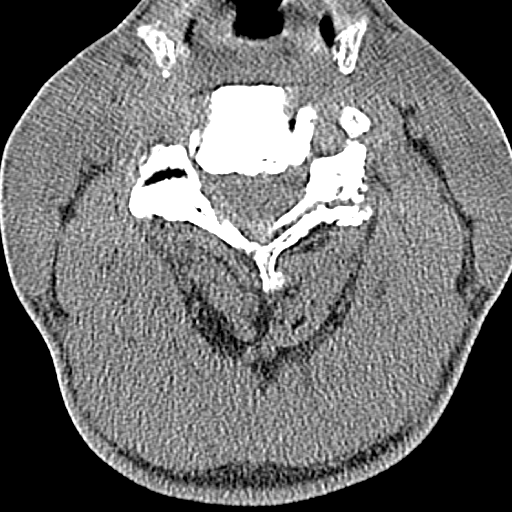
[im 59/104  bone]
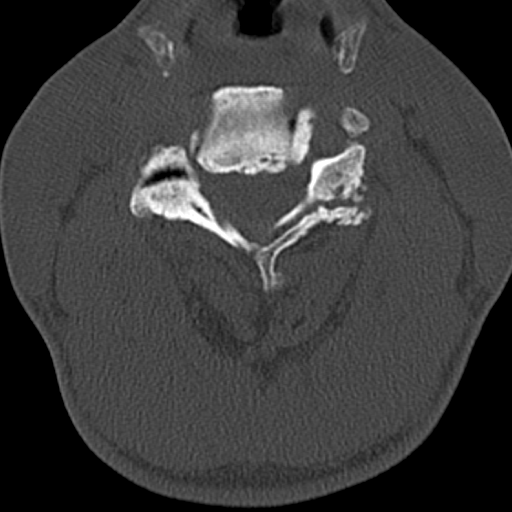

[9 of 33 positions shown; findings below may reference images not displayed]

FINDINGS: CT HEAD FINDINGS

Brain:

Cerebral volume is normal.

There is no acute intracranial hemorrhage.

No demarcated cortical infarct.

No extra-axial fluid collection.

No evidence of an intracranial mass.

No midline shift.

Vascular: No hyperdense vessel.

Skull: Normal. Negative for fracture or focal lesion.

Sinuses/Orbits: Visualized orbits show no acute finding. Trace
scattered paranasal sinus mucosal thickening at the imaged levels.

CT CERVICAL SPINE FINDINGS

Alignment: Focal lordotic angulation at the C4-C5 level. There is
widening of the disc space anteriorly at C4-C5 measuring 5 mm. This
is similar in appearance as compared to the prior cervical spine
radiographs of 08/16/2010. Trace C4-C5 grade 1 retrolisthesis.

Skull base and vertebrae: Bilateral atlantooccipital fusion. The
basion-dental and atlanto-dental intervals are maintained. MRI would
be needed to definitively exclude a fracture through the C4-C5 disc
space. No evidence of acute fracture to the cervical spine
elsewhere. Redemonstrated findings of ankylosing spondylitis with
bridging syndesmophytes, early osseous fusion across the disc space
and facet joint ankylosis at multiple levels throughout the cervical
and visualized upper thoracic spine. Notably, there are no bridging
syndesmophytes at C4-C5, and there is no facet joint ankylosis on
the right at C4-C5 or on the left at C3-C4.

Soft tissues and spinal canal: No prevertebral fluid or swelling. No
visible canal hematoma.

Disc levels:

Cervical spondylosis greatest at C4-C5.

At C4-C5, there is mild disc space narrowing, broad-based central
disc protrusion. Endplate spurring with bilateral disc osteophyte
ridge/uncinate hypertrophy. Facet arthrosis and ligamentum flavum
hypertrophy. Apparent mild/moderate spinal canal stenosis. Advanced
right-sided bony neural foraminal narrowing.

Upper chest: No consolidation within the imaged lung apices.
Scarring within the right lung apex.
IMPRESSION: CT head:

1. No evidence of acute intracranial abnormality.
2. Mild paranasal sinus mucosal thickening at the imaged levels.

CT cervical spine:

1. Redemonstrated findings compatible with ankylosing spondylitis,
as described.
2. Widening of the disc space anteriorly at C4-C5 with somewhat
focal lordosis centered at this level. These findings are similar as
compared to the cervical spine radiographs of 08/16/2010. However,
in the setting of trauma an MRI would be needed to definitively
exclude a fracture through the C4-C5 disc space or ligamentous
injury at this level.
3. Trace C4-C5 grade 1 retrolisthesis, also similar to the prior
cervical spine radiographs of 08/16/2010.
[DATE]. Cervical spondylosis, as described and greatest at C4-C5.
